# Patient Record
Sex: Female | Born: 1995 | Race: White | Hispanic: No | Marital: Single | Smoking: Current every day smoker
Health system: Southern US, Community
[De-identification: ages and names within clinical notes are randomized; demographics above are authoritative.]

## PROBLEM LIST (undated history)

## (undated) DIAGNOSIS — F909 Attention-deficit hyperactivity disorder, unspecified type: Secondary | ICD-10-CM

## (undated) DIAGNOSIS — N289 Disorder of kidney and ureter, unspecified: Secondary | ICD-10-CM

## (undated) DIAGNOSIS — F419 Anxiety disorder, unspecified: Secondary | ICD-10-CM

---

## 2014-10-10 ENCOUNTER — Emergency Department (HOSPITAL_BASED_OUTPATIENT_CLINIC_OR_DEPARTMENT_OTHER): Payer: Medicaid Other

## 2014-10-10 ENCOUNTER — Emergency Department (HOSPITAL_BASED_OUTPATIENT_CLINIC_OR_DEPARTMENT_OTHER)
Admission: EM | Admit: 2014-10-10 | Discharge: 2014-10-11 | Disposition: A | Discharge status: Home / Self Care | Payer: Medicaid Other | Attending: Emergency Medicine | Admitting: Emergency Medicine

## 2014-10-10 ENCOUNTER — Encounter (HOSPITAL_BASED_OUTPATIENT_CLINIC_OR_DEPARTMENT_OTHER): Payer: Self-pay

## 2014-10-10 DIAGNOSIS — R52 Pain, unspecified: Secondary | ICD-10-CM

## 2014-10-10 DIAGNOSIS — Z720 Tobacco use: Secondary | ICD-10-CM | POA: Insufficient documentation

## 2014-10-10 DIAGNOSIS — R109 Unspecified abdominal pain: Secondary | ICD-10-CM | POA: Insufficient documentation

## 2014-10-10 DIAGNOSIS — Z3202 Encounter for pregnancy test, result negative: Secondary | ICD-10-CM | POA: Diagnosis not present

## 2014-10-10 DIAGNOSIS — R112 Nausea with vomiting, unspecified: Secondary | ICD-10-CM | POA: Insufficient documentation

## 2014-10-10 DIAGNOSIS — R05 Cough: Secondary | ICD-10-CM | POA: Insufficient documentation

## 2014-10-10 DIAGNOSIS — Z79899 Other long term (current) drug therapy: Secondary | ICD-10-CM | POA: Diagnosis not present

## 2014-10-10 LAB — URINALYSIS, ROUTINE W REFLEX MICROSCOPIC
Bilirubin Urine: NEGATIVE
Glucose, UA: NEGATIVE mg/dL
Hgb urine dipstick: NEGATIVE
Ketones, ur: NEGATIVE mg/dL
Leukocytes, UA: NEGATIVE
Nitrite: NEGATIVE
PH: 7 (ref 5.0–8.0)
Protein, ur: NEGATIVE mg/dL
Specific Gravity, Urine: 1.012 (ref 1.005–1.030)
Urobilinogen, UA: 1 mg/dL (ref 0.0–1.0)

## 2014-10-10 LAB — CBC WITH DIFFERENTIAL/PLATELET
Basophils Absolute: 0 10*3/uL (ref 0.0–0.1)
Basophils Relative: 0 % (ref 0–1)
EOS ABS: 0.6 10*3/uL (ref 0.0–0.7)
EOS PCT: 6 % — AB (ref 0–5)
HEMATOCRIT: 37.9 % (ref 36.0–46.0)
Hemoglobin: 12.7 g/dL (ref 12.0–15.0)
LYMPHS ABS: 3 10*3/uL (ref 0.7–4.0)
LYMPHS PCT: 29 % (ref 12–46)
MCH: 31.1 pg (ref 26.0–34.0)
MCHC: 33.5 g/dL (ref 30.0–36.0)
MCV: 92.9 fL (ref 78.0–100.0)
Monocytes Absolute: 0.6 10*3/uL (ref 0.1–1.0)
Monocytes Relative: 6 % (ref 3–12)
Neutro Abs: 6 10*3/uL (ref 1.7–7.7)
Neutrophils Relative %: 59 % (ref 43–77)
Platelets: 242 10*3/uL (ref 150–400)
RBC: 4.08 MIL/uL (ref 3.87–5.11)
RDW: 13.6 % (ref 11.5–15.5)
WBC: 10.2 10*3/uL (ref 4.0–10.5)

## 2014-10-10 LAB — PREGNANCY, URINE: PREG TEST UR: NEGATIVE

## 2014-10-10 MED ORDER — KETOROLAC TROMETHAMINE 30 MG/ML IJ SOLN
30.0000 mg | Freq: Once | INTRAMUSCULAR | Status: AC
  Administered 2014-10-10: 30 mg via INTRAVENOUS
  Filled 2014-10-10: qty 1

## 2014-10-10 MED ORDER — SODIUM CHLORIDE 0.9 % IV BOLUS (SEPSIS)
500.0000 mL | Freq: Once | INTRAVENOUS | Status: AC
  Administered 2014-10-10: 500 mL via INTRAVENOUS

## 2014-10-10 NOTE — ED Notes (Signed)
Abd pain n/v x 2 hours

## 2014-10-10 NOTE — ED Provider Notes (Addendum)
CSN: [id number]     Arrival date & time 10/10/14  2141 History   This chart was scribed for Angel Olson Smitty CordsK Germani Gavilanes-Rasch, MD by Evon Slackerrance Branch, ED Scribe. This patient was seen in room MH10/MH10 and the patient's care was started at 11:04 PM.    Chief Complaint  Patient presents with  . Abdominal Pain   Patient is a 19 y.o. female presenting with abdominal pain. The history is provided by the patient. No language interpreter was used.  Abdominal Pain Pain location:  R flank Pain quality: sharp   Pain radiates to:  Does not radiate Pain severity:  Severe Onset quality:  Sudden Timing:  Constant Progression:  Unchanged Chronicity:  New Context: not retching   Relieved by:  Nothing Worsened by:  Nothing tried Ineffective treatments:  None tried Associated symptoms: cough, nausea and vomiting   Associated symptoms: no chest pain, no constipation, no diarrhea, no dysuria and no shortness of breath   Risk factors: no alcohol abuse    HPI Comments: Angel Olson is a 19 y.o. female who presents to the Emergency Department complaining of right sided abdominal pain onset 6 PM tonight. Pt rates the severity of her pain 9/10. Pt states she has associated nausea and vomiting.  Pt also presents with cough with URI.   Pt states that the pain feels as if "her insides are coming out." Pt states that pain began while at rest. Pt states she had normal bowel movement today. Pt denies any medication PTA. Pt denies any suspect food intake. Denies falls or injuries. Denies diarrhea, dysuria, frequency or constipation.   History reviewed. No pertinent past medical history. History reviewed. No pertinent past surgical history. No family history on file. History  Substance Use Topics  . Smoking status: Current Every Day Smoker  . Smokeless tobacco: Not on file  . Alcohol Use: No   OB History    No data available     Review of Systems  Respiratory: Positive for cough. Negative for chest tightness,  shortness of breath and wheezing.   Cardiovascular: Negative for chest pain, palpitations and leg swelling.  Gastrointestinal: Positive for nausea, vomiting and abdominal pain. Negative for diarrhea and constipation.  Genitourinary: Negative for dysuria and frequency.  All other systems reviewed and are negative.     Allergies  Review of patient's allergies indicates no known allergies.  Home Medications   Prior to Admission medications   Medication Sig Start Date End Date Taking? Authorizing Provider  ALPRAZolam (XANAX PO) Take by mouth.   Yes Historical Provider, MD  Amphetamine-Dextroamphetamine (ADDERALL PO) Take by mouth.   Yes Historical Provider, MD   Triage Vitals: BP 125/77 mmHg  Pulse 98  Temp(Src) 98.4 F (36.9 C) (Oral)  Resp 16  Ht 5\' 6"  (1.676 m)  Wt 130 lb (58.968 kg)  BMI 20.99 kg/m2  SpO2 95%  LMP 10/03/2014  Physical Exam  Constitutional: She is oriented to person, place, and time. She appears well-developed and well-nourished. No distress.  HENT:  Head: Normocephalic and atraumatic.  Mouth/Throat: Oropharynx is clear and moist.  Eyes: Conjunctivae and EOM are normal. Pupils are equal, round, and reactive to light.  Neck: Normal range of motion. Neck supple. No tracheal deviation present.  Cardiovascular: Normal rate and regular rhythm.   Pulmonary/Chest: Effort normal and breath sounds normal. No respiratory distress.  Abdominal: Soft. Bowel sounds are increased. There is no hepatosplenomegaly. There is no tenderness. There is no rebound, no guarding, no CVA tenderness, no  tenderness at McBurney's point and negative Murphy's sign.  Musculoskeletal: Normal range of motion.  Neurological: She is alert and oriented to person, place, and time. She has normal reflexes.  Skin: Skin is warm and dry.  Psychiatric: She has a normal mood and affect. Her behavior is normal.  Nursing note and vitals reviewed.   ED Course  Procedures (including critical care  time) DIAGNOSTIC STUDIES: Oxygen Saturation is 95% on RA, adequate by my interpretation.    COORDINATION OF CARE: 11:27 PM-Discussed treatment plan with pt at bedside and pt agreed to plan.     Labs Review Labs Reviewed  URINALYSIS, ROUTINE W REFLEX MICROSCOPIC  PREGNANCY, URINE    Imaging Review No results found.   EKG Interpretation None      MDM   Final diagnoses:  None    Scan and labs are negative pain is likely secondary to straining from coughing.  Will prescribe pain medication tessalon and muscle relaxant follow up with your PMD for ongoing care.    Patient resting comfortably in room at discharge patient now states there must be something wrong and has been at every healthcare system in this area for 2 years for pain.  The pain is 2 years duration.     I personally performed the services described in this documentation, which was scribed in my presence. The recorded information has been reviewed and is accurate.       Jasmine Awe, MD 10/11/14 [id number]  Lataya Varnell K Katryna Tschirhart-Rasch, MD 10/11/14 [phone]

## 2014-10-11 ENCOUNTER — Encounter (HOSPITAL_BASED_OUTPATIENT_CLINIC_OR_DEPARTMENT_OTHER): Payer: Self-pay | Admitting: Emergency Medicine

## 2014-10-11 LAB — COMPREHENSIVE METABOLIC PANEL
ALT: 70 U/L — AB (ref 0–35)
AST: 49 U/L — AB (ref 0–37)
Albumin: 4 g/dL (ref 3.5–5.2)
Alkaline Phosphatase: 64 U/L (ref 39–117)
Anion gap: 5 (ref 5–15)
BILIRUBIN TOTAL: 0.2 mg/dL — AB (ref 0.3–1.2)
BUN: 11 mg/dL (ref 6–23)
CALCIUM: 8.8 mg/dL (ref 8.4–10.5)
CHLORIDE: 109 meq/L (ref 96–112)
CO2: 25 mmol/L (ref 19–32)
CREATININE: 0.63 mg/dL (ref 0.50–1.10)
GFR calc Af Amer: >90 mL/min (ref >90)
GFR calc non Af Amer: >90 mL/min (ref >90)
Glucose, Bld: 116 mg/dL — ABNORMAL HIGH (ref 70–99)
Potassium: 3.7 mmol/L (ref 3.5–5.1)
Sodium: 139 mmol/L (ref 135–145)
Total Protein: 6.4 g/dL (ref 6.0–8.3)

## 2014-10-11 MED ORDER — METHOCARBAMOL 500 MG PO TABS
1000.0000 mg | ORAL_TABLET | Freq: Once | ORAL | Status: AC
  Administered 2014-10-11: 1000 mg via ORAL
  Filled 2014-10-11: qty 2

## 2014-10-11 MED ORDER — BENZONATATE 100 MG PO CAPS: 100.0000 mg | ORAL_CAPSULE | Freq: Three times a day (TID) | ORAL | Status: DC

## 2014-10-11 MED ORDER — OXYCODONE-ACETAMINOPHEN 5-325 MG PO TABS
1.0000 | ORAL_TABLET | Freq: Once | ORAL | Status: AC
  Administered 2014-10-11: 1 via ORAL
  Filled 2014-10-11: qty 1

## 2014-10-11 MED ORDER — METHOCARBAMOL 500 MG PO TABS: 500.0000 mg | ORAL_TABLET | Freq: Two times a day (BID) | ORAL | Status: DC

## 2014-10-11 MED ORDER — HYDROCODONE-ACETAMINOPHEN 7.5-325 MG/15ML PO SOLN: 10.0000 mL | Freq: Four times a day (QID) | ORAL | Status: DC | PRN

## 2015-05-13 IMAGING — CT CT RENAL STONE PROTOCOL
2 of 4 series · 16 of 46 positions shown, 18 images · non-contrast
Comparison: CT of the abdomen and pelvis from 02/28/2014, and
abdominal ultrasound performed 06/22/2014

CLINICAL DATA: Chronic right-sided abdominal pain for 2 years,
worsened tonight. Initial encounter.

EXAM:
CT ABDOMEN AND PELVIS WITHOUT CONTRAST
TECHNIQUE: Multidetector CT imaging of the abdomen and pelvis was performed
following the standard protocol without IV contrast.

[Series 2: renal stone < 200 lbs 5.0 b31f · axial · 0.77mm/px · z∈[-587,-157]mm · 13 of 96 slices shown, 15 images]
[im 5/96  soft-tissue]
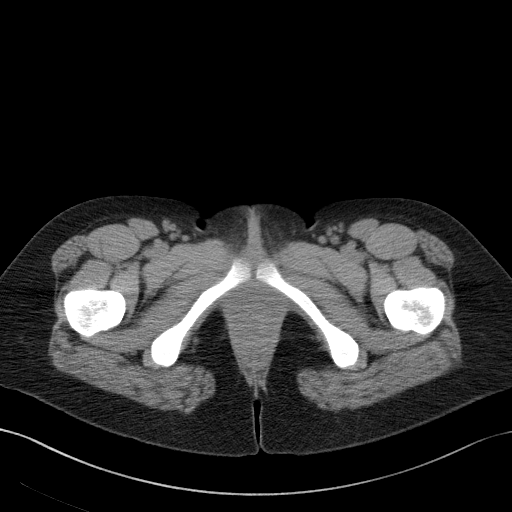
[im 5/96  bone]
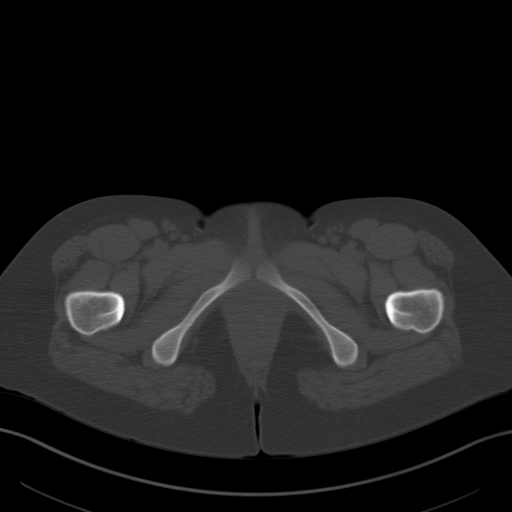
[im 13/96  soft-tissue]
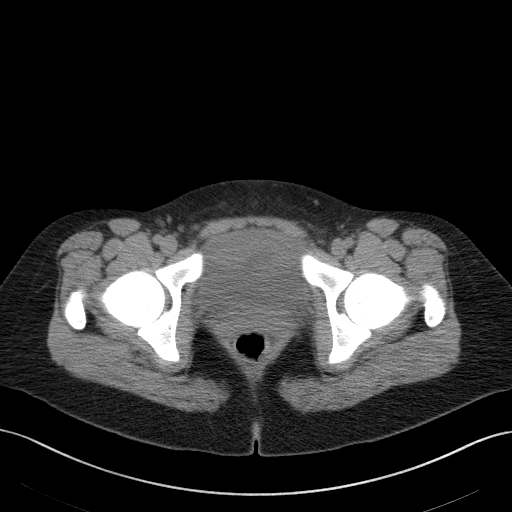
[im 22/96  soft-tissue]
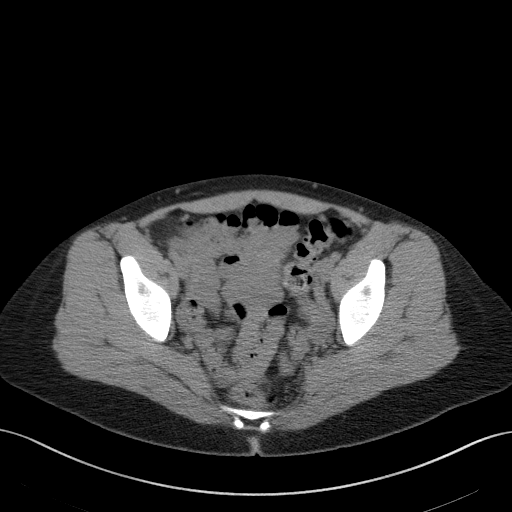
[im 26/96  soft-tissue]
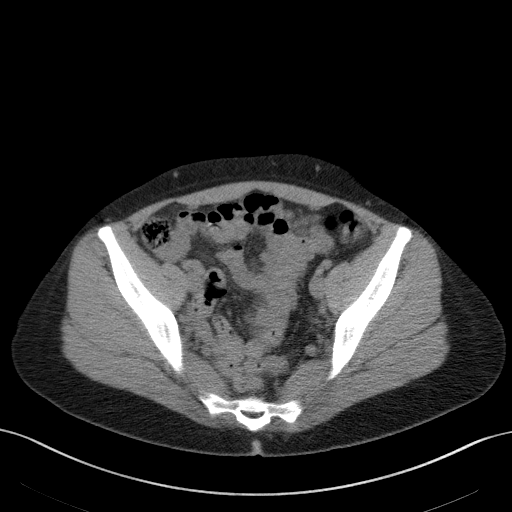
[im 35/96  soft-tissue]
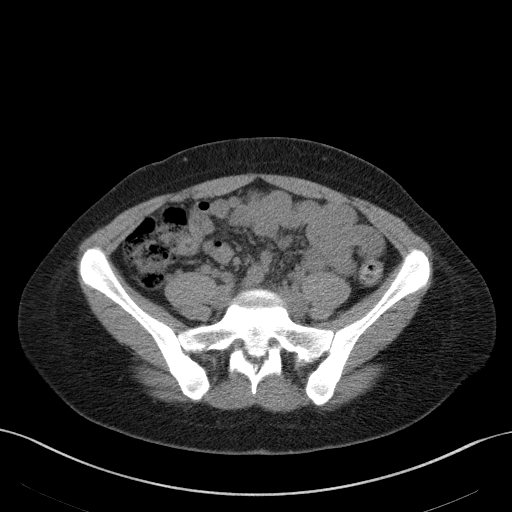
[im 39/96  soft-tissue]
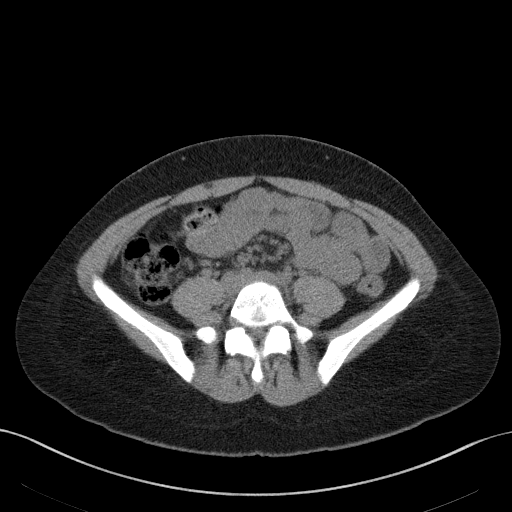
[im 48/96  soft-tissue]
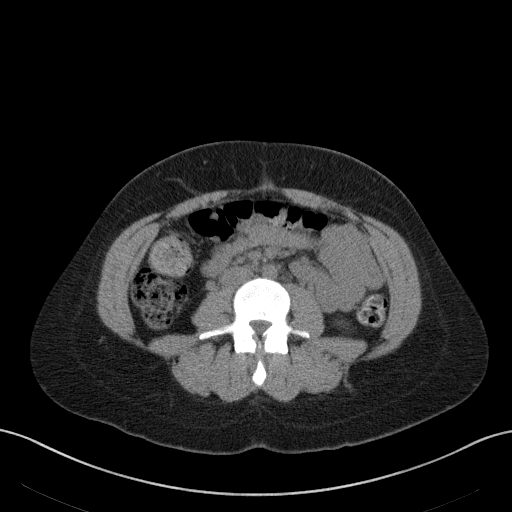
[im 57/96  soft-tissue]
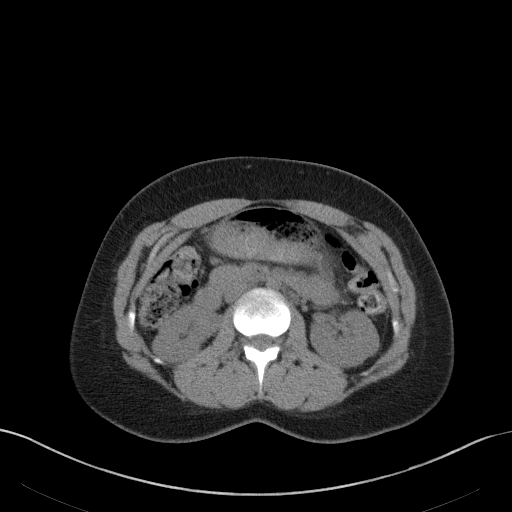
[im 61/96  soft-tissue]
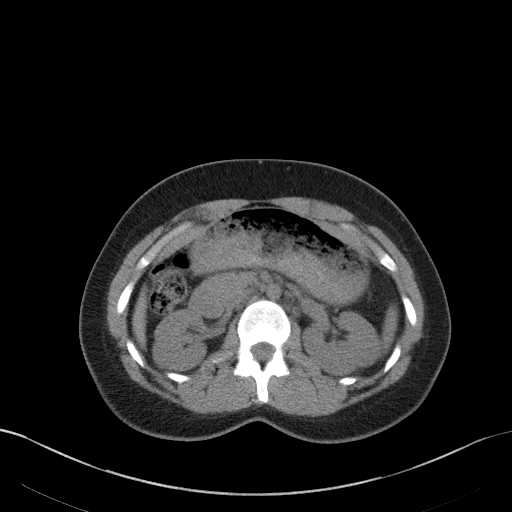
[im 61/96  bone]
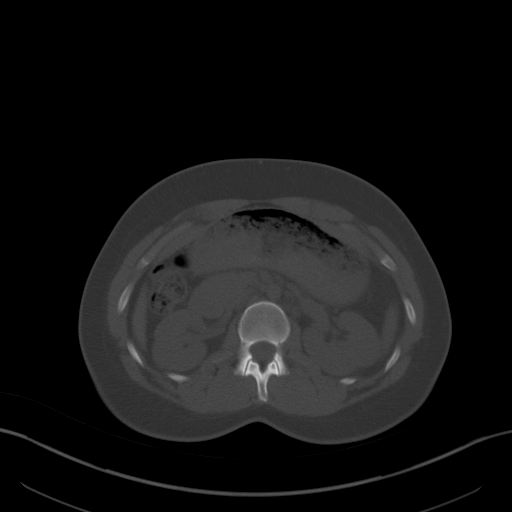
[im 70/96  soft-tissue]
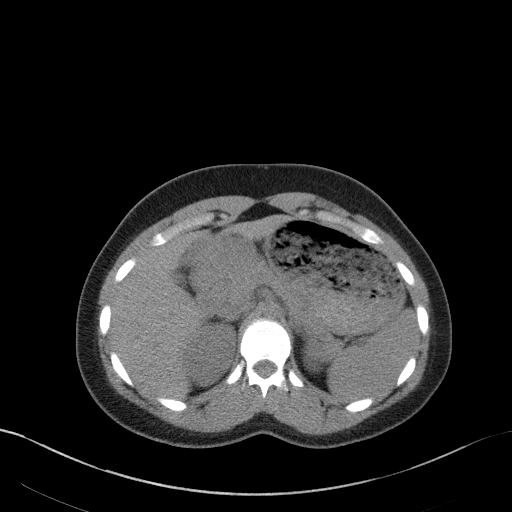
[im 74/96  soft-tissue]
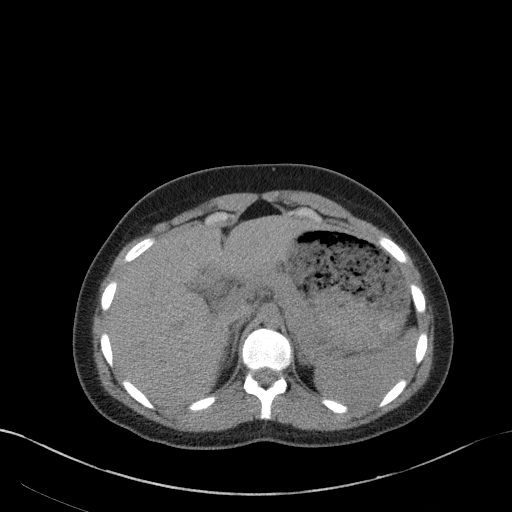
[im 83/96  soft-tissue]
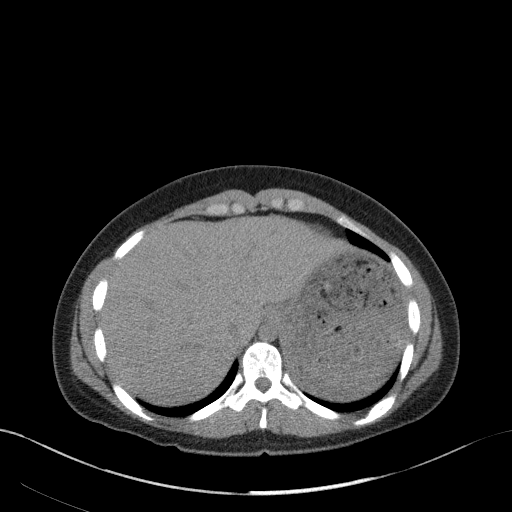
[im 91/96  soft-tissue]
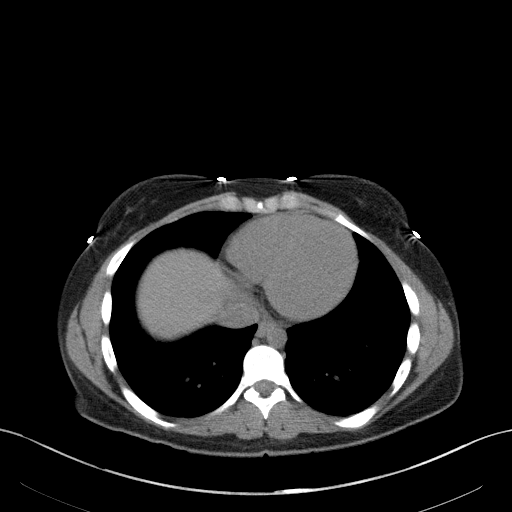

[Series 5: renal stone 3.0 coronal · coronal · 0.63mm/px · 3 of 75 slices shown]
[im 25/75  soft-tissue]
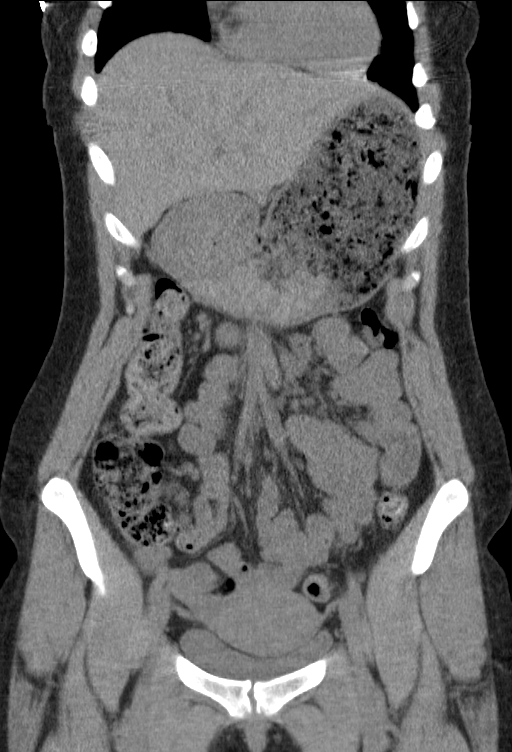
[im 33/75  soft-tissue]
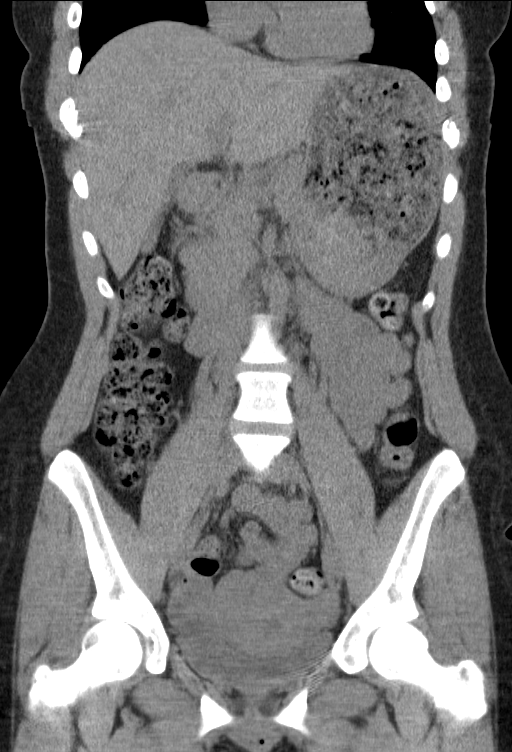
[im 42/75  soft-tissue]
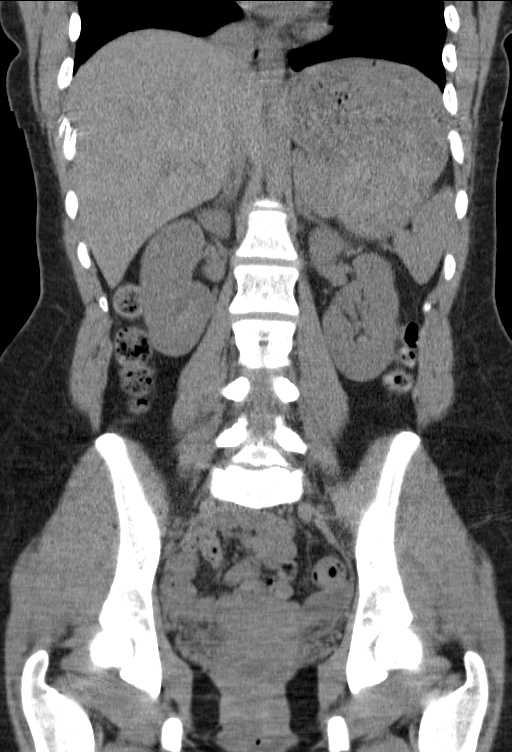

[16 of 46 positions shown; findings below may reference images not displayed]

FINDINGS: The visualized lung bases are clear.

The liver and spleen are unremarkable in appearance. The gallbladder
is within normal limits. The pancreas and adrenal glands are
unremarkable.

The kidneys are unremarkable in appearance. Mild bilateral fetal
lobulations are noted. There is no evidence of hydronephrosis. No
renal or ureteral stones are seen. No perinephric stranding is
appreciated.

No free fluid is identified. The small bowel is unremarkable in
appearance. The stomach is within normal limits. No acute vascular
abnormalities are seen.

The appendix is normal in caliber, without evidence for
appendicitis. The colon is unremarkable in appearance.

The bladder is mildly distended and grossly unremarkable. The uterus
is unremarkable in appearance. The ovaries are relatively symmetric.
No suspicious adnexal masses are seen. No inguinal lymphadenopathy
is seen.

No acute osseous abnormalities are identified.
IMPRESSION: Unremarkable noncontrast CT of the abdomen and pelvis.

## 2015-05-14 IMAGING — CR DG CHEST 2V
2 series · 2 of 2 positions shown · non-contrast
Comparison: None.

CLINICAL DATA: Mid chest pain and cough for 2 weeks.  Smoker.

EXAM:
CHEST  2 VIEW

[w chest pa]
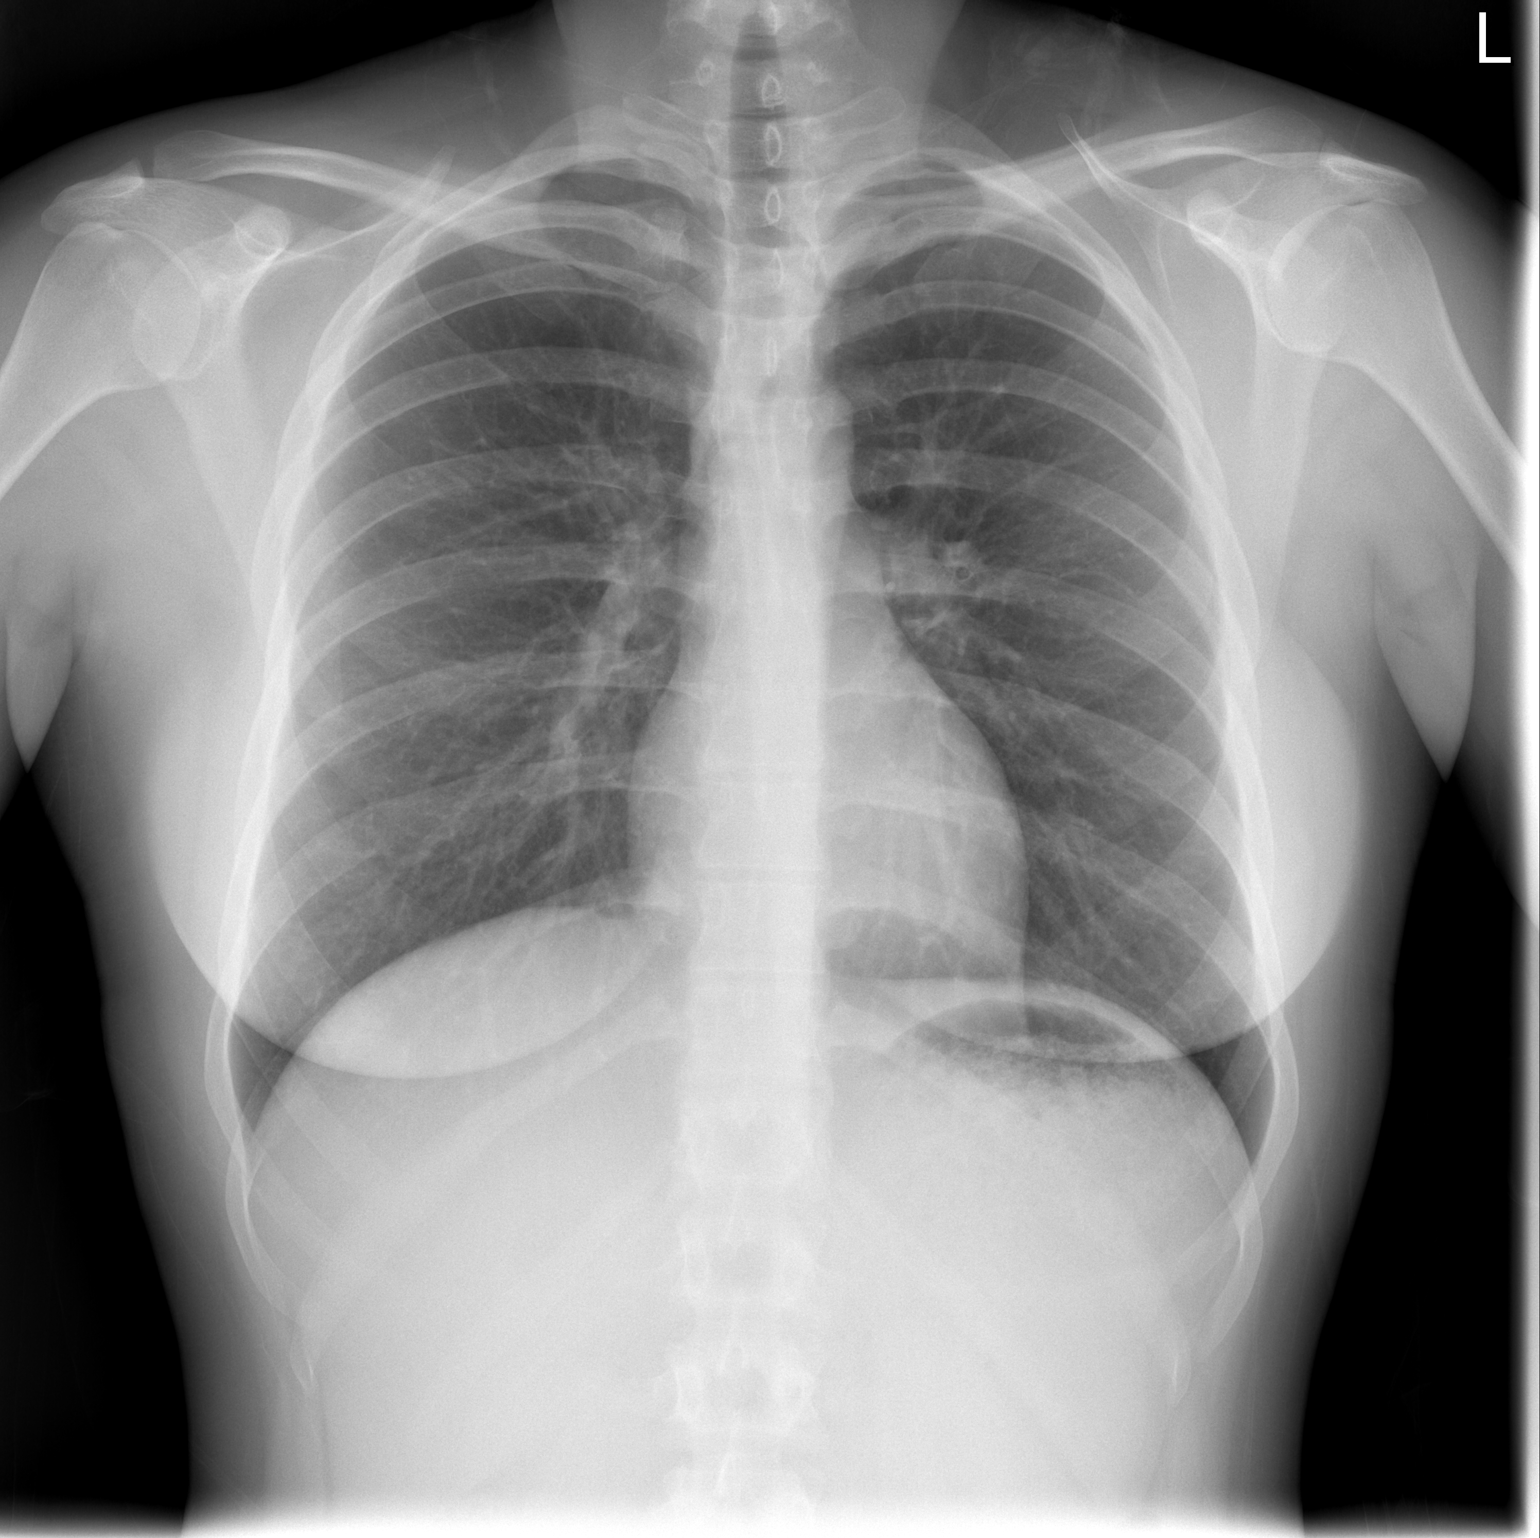

[w chest lat]
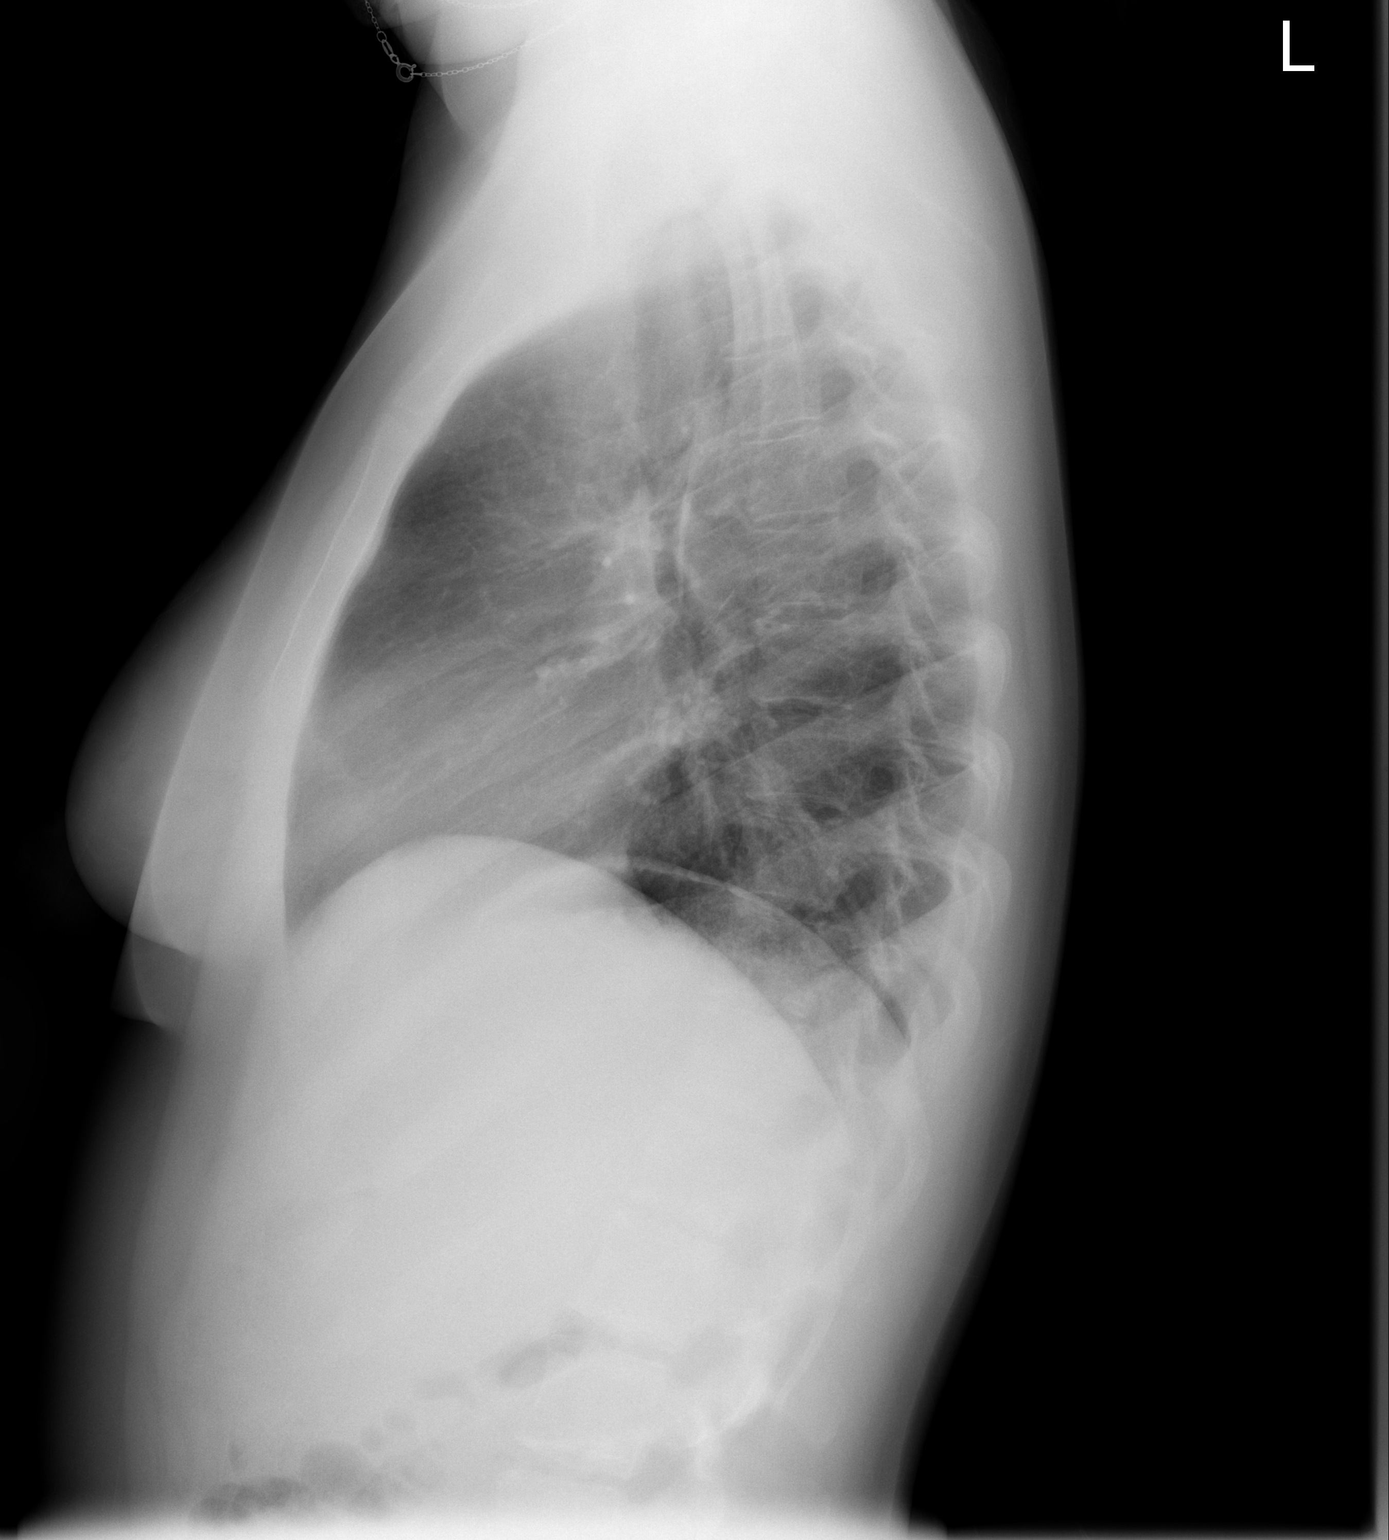

[2 of 2 positions shown; findings below may reference images not displayed]

FINDINGS: The heart size and mediastinal contours are within normal limits.
Both lungs are clear. The visualized skeletal structures are
unremarkable.
IMPRESSION: No active cardiopulmonary disease.

## 2016-03-05 ENCOUNTER — Encounter (HOSPITAL_BASED_OUTPATIENT_CLINIC_OR_DEPARTMENT_OTHER): Payer: Self-pay | Admitting: *Deleted

## 2016-03-05 ENCOUNTER — Emergency Department (HOSPITAL_BASED_OUTPATIENT_CLINIC_OR_DEPARTMENT_OTHER)
Admission: EM | Admit: 2016-03-05 | Discharge: 2016-03-05 | Disposition: A | Discharge status: Home / Self Care | Payer: Medicaid Other | Attending: Emergency Medicine | Admitting: Emergency Medicine

## 2016-03-05 DIAGNOSIS — L02215 Cutaneous abscess of perineum: Secondary | ICD-10-CM | POA: Insufficient documentation

## 2016-03-05 DIAGNOSIS — L02219 Cutaneous abscess of trunk, unspecified: Secondary | ICD-10-CM

## 2016-03-05 DIAGNOSIS — F17210 Nicotine dependence, cigarettes, uncomplicated: Secondary | ICD-10-CM | POA: Insufficient documentation

## 2016-03-05 DIAGNOSIS — F909 Attention-deficit hyperactivity disorder, unspecified type: Secondary | ICD-10-CM | POA: Insufficient documentation

## 2016-03-05 HISTORY — DX: Anxiety disorder, unspecified: F41.9

## 2016-03-05 HISTORY — DX: Attention-deficit hyperactivity disorder, unspecified type: F90.9

## 2016-03-05 MED ORDER — LIDOCAINE-EPINEPHRINE 2 %-1:100000 IJ SOLN
20.0000 mL | Freq: Once | INTRAMUSCULAR | Status: AC
  Administered 2016-03-05: 20 mL via INTRADERMAL

## 2016-03-05 MED ORDER — LIDOCAINE-EPINEPHRINE 2 %-1:100000 IJ SOLN
INTRAMUSCULAR | Status: AC
  Administered 2016-03-05: 20 mL via INTRADERMAL
  Filled 2016-03-05: qty 1

## 2016-03-05 MED ORDER — HYDROCODONE-ACETAMINOPHEN 5-325 MG PO TABS: 1.0000 | ORAL_TABLET | Freq: Once | ORAL | Status: DC

## 2016-03-05 MED ORDER — IBUPROFEN 800 MG PO TABS: 800.0000 mg | ORAL_TABLET | Freq: Three times a day (TID) | ORAL | Status: DC | PRN

## 2016-03-05 MED ORDER — HYDROMORPHONE HCL 1 MG/ML IJ SOLN
2.0000 mg | Freq: Once | INTRAMUSCULAR | Status: AC
  Administered 2016-03-05: 2 mg via INTRAMUSCULAR
  Filled 2016-03-05: qty 2

## 2016-03-05 MED ORDER — LIDOCAINE-EPINEPHRINE (PF) 2 %-1:200000 IJ SOLN: 10.0000 mL | Freq: Once | INTRAMUSCULAR | Status: DC

## 2016-03-05 MED ORDER — SULFAMETHOXAZOLE-TRIMETHOPRIM 800-160 MG PO TABS: 1.0000 | ORAL_TABLET | Freq: Once | ORAL | Status: DC

## 2016-03-05 MED ORDER — SULFAMETHOXAZOLE-TRIMETHOPRIM 800-160 MG PO TABS: 1.0000 | ORAL_TABLET | Freq: Two times a day (BID) | ORAL | Status: DC

## 2016-03-05 MED ORDER — HYDROCODONE-ACETAMINOPHEN 5-325 MG PO TABS: 1.0000 | ORAL_TABLET | ORAL | Status: DC | PRN

## 2016-03-05 MED ORDER — CEPHALEXIN 500 MG PO CAPS: 500.0000 mg | ORAL_CAPSULE | Freq: Four times a day (QID) | ORAL | Status: DC

## 2016-03-05 NOTE — ED Provider Notes (Signed)
CSN: [id number]     Arrival date & time 03/05/16  1254 History   First MD Initiated Contact with Patient 03/05/16 1303     Chief Complaint  Patient presents with  . Abscess     (Consider location/radiation/quality/duration/timing/severity/associated sxs/prior Treatment) The history is provided by the patient.     Pt presents with 4 days of large swollen area in the left pubic region.  It is painful.  Associated fever to 100 that began last night, nausea, inguinal lymph node enlargement. Has tried warm compresses without improvement.  Denies vomiting, urinary, vaginal, or bowel symptoms.  LMP started 3 days ago and was normal and on time.    Past Medical History  Diagnosis Date  . Anxiety   . ADHD (attention deficit hyperactivity disorder)    History reviewed. No pertinent past surgical history. No family history on file. Social History  Substance Use Topics  . Smoking status: Current Every Day Smoker -- 0.50 packs/day    Types: Cigarettes  . Smokeless tobacco: None  . Alcohol Use: No   OB History    No data available     Review of Systems  All other systems reviewed and are negative.     Allergies  Review of patient's allergies indicates no known allergies.  Home Medications   Prior to Admission medications   Medication Sig Start Date End Date Taking? Authorizing Provider  ALPRAZolam (XANAX PO) Take by mouth.    Historical Provider, MD  Amphetamine-Dextroamphetamine (ADDERALL PO) Take by mouth.    Historical Provider, MD  benzonatate (TESSALON) 100 MG capsule Take 1 capsule (100 mg total) by mouth every 8 (eight) hours. 10/11/14   April Palumbo, MD  HYDROcodone-acetaminophen (HYCET) 7.5-325 mg/15 ml solution Take 10 mLs by mouth every 6 (six) hours as needed for severe pain. 10/11/14   April Palumbo, MD  methocarbamol (ROBAXIN) 500 MG tablet Take 1 tablet (500 mg total) by mouth 2 (two) times daily. 10/11/14   April Palumbo, MD   BP 111/69 mmHg  Pulse 107  Temp(Src) 98  F (36.7 C) (Oral)  Resp 20  Ht  (1.702 m)  Wt 58.968 kg  BMI 20.36 kg/m2  SpO2 100%  LMP 03/01/2016 Physical Exam  Constitutional: She appears well-developed and well-nourished. No distress.  Uncomfortable appearing   HENT:  Head: Normocephalic and atraumatic.  Neck: Neck supple.  Pulmonary/Chest: Effort normal.  Genitourinary:  Shaved pubic area with few scattered pustules.  Left mons with raised area with fluctuance, central black escar.  No active drainage.  Tender to palpation.   Few scattered papules and pustules over shaved pubic area.    Neurological: She is alert.  Skin: She is not diaphoretic.  Nursing note and vitals reviewed.   ED Course  Procedures (including critical care time) Labs Review Labs Reviewed - No data to display  Imaging Review No results found. I have personally reviewed and evaluated these images and lab results as part of my medical decision-making.   EKG Interpretation None       INCISION AND DRAINAGE Performed by: Trixie Dredge Consent: Verbal consent obtained. Risks and benefits: risks, benefits and alternatives were discussed Type: abscess  Body area: left mons  Anesthesia: local infiltration  Incision was made with a scalpel.  Local anesthetic: lidocaine 2% with epinephrine  Anesthetic total: 4 ml  Complexity: complex Blunt dissection to break up loculations  Drainage: purulent  Drainage amount: moderate   Packing material: none  Flushed abscess cavity with normal saline.  Patient tolerance: Patient tolerated the procedure well with no immediate complications.     MDM   Final diagnoses:  Abscess of pubic region   Afebrile, nontoxic patient with abscess over pubic region.  No overlying cellulitis.  She does not appear septic.  Pt has multiple smaller areas with pustules c/w folliculitis.  She has shaved the entire area.  I have advised her to stop shaving until all of these areas are clear.  I&D in ED.   Discussed home care.  Pt given bactrim in ED.   D/C home with bactrim, keflex, norco, motrin, return precautions, suggested 2 days recheck.  Discussed result, findings, treatment, and follow up  with patient.  Pt given return precautions.  Pt verbalizes understanding and agrees with plan.        Trixie Dredgemily Yanelli Zapanta, PA-C 03/05/16 1627  Raeford RazorStephen Kohut, MD 03/12/16 [phone]

## 2016-03-05 NOTE — ED Notes (Signed)
States she has a large boil the size of a baseball on her pubic area.

## 2016-03-05 NOTE — ED Notes (Signed)
Family at bedside. 

## 2016-03-05 NOTE — ED Notes (Signed)
Assisted PA with I&D of abcess on Pt. Pelvic region.

## 2016-03-05 NOTE — Discharge Instructions (Signed)
Read the information below.  Use the prescribed medication as directed.  Please discuss all new medications with your pharmacist.  Do not take additional tylenol while taking the prescribed pain medication to avoid overdose.  You may return to the Emergency Department at any time for worsening condition or any new symptoms that concern you.    If you develop increased redness, swelling, worsening pain, or fevers greater than 100.4, return to the ER immediately for a recheck.     Abscess An abscess is an infected area that contains a collection of pus and debris.It can occur in almost any part of the body. An abscess is also known as a furuncle or boil. CAUSES  An abscess occurs when tissue gets infected. This can occur from blockage of oil or sweat glands, infection of hair follicles, or a minor injury to the skin. As the body tries to fight the infection, pus collects in the area and creates pressure under the skin. This pressure causes pain. People with weakened immune systems have difficulty fighting infections and get certain abscesses more often.  SYMPTOMS Usually an abscess develops on the skin and becomes a painful mass that is red, warm, and tender. If the abscess forms under the skin, you may feel a moveable soft area under the skin. Some abscesses break open (rupture) on their own, but most will continue to get worse without care. The infection can spread deeper into the body and eventually into the bloodstream, causing you to feel ill.  DIAGNOSIS  Your caregiver will take your medical history and perform a physical exam. A sample of fluid may also be taken from the abscess to determine what is causing your infection. TREATMENT  Your caregiver may prescribe antibiotic medicines to fight the infection. However, taking antibiotics alone usually does not cure an abscess. Your caregiver may need to make a small cut (incision) in the abscess to drain the pus. In some cases, gauze is packed into the  abscess to reduce pain and to continue draining the area. HOME CARE INSTRUCTIONS   Only take over-the-counter or prescription medicines for pain, discomfort, or fever as directed by your caregiver.  If you were prescribed antibiotics, take them as directed. Finish them even if you start to feel better.  If gauze is used, follow your caregiver's directions for changing the gauze.  To avoid spreading the infection:  Keep your draining abscess covered with a bandage.  Wash your hands well.  Do not share personal care items, towels, or whirlpools with others.  Avoid skin contact with others.  Keep your skin and clothes clean around the abscess.  Keep all follow-up appointments as directed by your caregiver. SEEK MEDICAL CARE IF:   You have increased pain, swelling, redness, fluid drainage, or bleeding.  You have muscle aches, chills, or a general ill feeling.  You have a fever. MAKE SURE YOU:   Understand these instructions.  Will watch your condition.  Will get help right away if you are not doing well or get worse.   This information is not intended to replace advice given to you by your health care provider. Make sure you discuss any questions you have with your health care provider.   Document Released: 07/01/2005 Document Revised: 03/22/2012 Document Reviewed: 12/04/2011 Elsevier Interactive Patient Education 2016 Else[employer] Inc[employer]  Incision and Drainage Incision and drainage is a procedure in which a sac-like structure (cystic structure) is opened and drained. The area to be drained usually contains material such as  pus, fluid, or blood.  LET YOUR CAREGIVER KNOW ABOUT:   Allergies to medicine.  Medicines taken, including vitamins, herbs, eyedrops, over-the-counter medicines, and creams.  Use of steroids (by mouth or creams).  Previous problems with anesthetics or numbing medicines.  History of bleeding problems or blood clots.  Previous surgery.  Other health  problems, including diabetes and kidney problems.  Possibility of pregnancy, if this applies. RISKS AND COMPLICATIONS  Pain.  Bleeding.  Scarring.  Infection. BEFORE THE PROCEDURE  You may need to have an ultrasound or other imaging tests to see how large or deep your cystic structure is. Blood tests may also be used to determine if you have an infection or how severe the infection is. You may need to have a tetanus shot. PROCEDURE  The affected area is cleaned with a cleaning fluid. The cyst area will then be numbed with a medicine (local anesthetic). A small incision will be made in the cystic structure. A syringe or catheter may be used to drain the contents of the cystic structure, or the contents may be squeezed out. The area will then be flushed with a cleansing solution. After cleansing the area, it is often gently packed with a gauze or another wound dressing. Once it is packed, it will be covered with gauze and tape or some other type of wound dressing. AFTER THE PROCEDURE   Often, you will be allowed to go home right after the procedure.  You may be given antibiotic medicine to prevent or heal an infection.  If the area was packed with gauze or some other wound dressing, you will likely need to come back in 1 to 2 days to get it removed.  The area should heal in about 14 days.   This information is not intended to replace advice given to you by your health care provider. Make sure you discuss any questions you have with your health care provider.   Document Released: 03/17/2001 Document Revised: 03/22/2012 Document Reviewed: 11/16/2011 Elsevier Interactive Patient Education [employer] Else[employer] Inc[employer]

## 2019-12-05 ENCOUNTER — Other Ambulatory Visit: Payer: Self-pay

## 2019-12-05 DIAGNOSIS — F17210 Nicotine dependence, cigarettes, uncomplicated: Secondary | ICD-10-CM | POA: Insufficient documentation

## 2019-12-05 DIAGNOSIS — R102 Pelvic and perineal pain: Secondary | ICD-10-CM | POA: Insufficient documentation

## 2019-12-05 DIAGNOSIS — Z79899 Other long term (current) drug therapy: Secondary | ICD-10-CM | POA: Insufficient documentation

## 2019-12-05 DIAGNOSIS — A599 Trichomoniasis, unspecified: Secondary | ICD-10-CM | POA: Insufficient documentation

## 2019-12-05 DIAGNOSIS — N939 Abnormal uterine and vaginal bleeding, unspecified: Secondary | ICD-10-CM | POA: Insufficient documentation

## 2019-12-06 ENCOUNTER — Ambulatory Visit (HOSPITAL_BASED_OUTPATIENT_CLINIC_OR_DEPARTMENT_OTHER): Admit: 2019-12-06 | Payer: Self-pay

## 2019-12-06 ENCOUNTER — Emergency Department (HOSPITAL_BASED_OUTPATIENT_CLINIC_OR_DEPARTMENT_OTHER)
Admission: EM | Admit: 2019-12-06 | Discharge: 2019-12-06 | Disposition: A | Discharge status: Home / Self Care | Payer: Self-pay | Attending: Emergency Medicine | Admitting: Emergency Medicine

## 2019-12-06 ENCOUNTER — Encounter (HOSPITAL_BASED_OUTPATIENT_CLINIC_OR_DEPARTMENT_OTHER): Payer: Self-pay | Admitting: Oncology

## 2019-12-06 ENCOUNTER — Other Ambulatory Visit: Payer: Self-pay

## 2019-12-06 DIAGNOSIS — R102 Pelvic and perineal pain: Secondary | ICD-10-CM

## 2019-12-06 DIAGNOSIS — A599 Trichomoniasis, unspecified: Secondary | ICD-10-CM

## 2019-12-06 DIAGNOSIS — N939 Abnormal uterine and vaginal bleeding, unspecified: Secondary | ICD-10-CM

## 2019-12-06 LAB — URINALYSIS, ROUTINE W REFLEX MICROSCOPIC
Bilirubin Urine: NEGATIVE
Glucose, UA: NEGATIVE mg/dL
Ketones, ur: NEGATIVE mg/dL
Leukocytes,Ua: NEGATIVE
Nitrite: NEGATIVE
Protein, ur: NEGATIVE mg/dL
Specific Gravity, Urine: 1.025 (ref 1.005–1.030)
pH: 6.5 (ref 5.0–8.0)

## 2019-12-06 LAB — URINALYSIS, MICROSCOPIC (REFLEX)

## 2019-12-06 LAB — WET PREP, GENITAL
Clue Cells Wet Prep HPF POC: NONE SEEN
Sperm: NONE SEEN
Yeast Wet Prep HPF POC: NONE SEEN

## 2019-12-06 LAB — HCG, QUANTITATIVE, PREGNANCY: hCG, Beta Chain, Quant, S: <1 m[IU]/mL (ref <5)

## 2019-12-06 LAB — PREGNANCY, URINE: Preg Test, Ur: NEGATIVE

## 2019-12-06 MED ORDER — METRONIDAZOLE 500 MG PO TABS
2000.0000 mg | ORAL_TABLET | Freq: Once | ORAL | Status: AC
  Administered 2019-12-06: 2000 mg via ORAL
  Filled 2019-12-06: qty 4

## 2019-12-06 NOTE — ED Provider Notes (Addendum)
Angel Olson [facility] Provider Note: Angel Spurling, MD, FACEP  CSN: [id number] MRN: [id number] ARRIVAL: 12/05/19 at [place]: [place]  Abdominal Pain   HISTORY OF PRESENT ILLNESS  12/06/19 12:11 AM Angel Olson is a 24 y.o. female who had a positive pregnancy test about a week and a half ago.  She is here with 2 days of right flank pain radiating to the right lower quadrant.  She rates the pain is a 6 out of 10.  It is cramping in nature.  It has been associated with red urine, and nausea but no vomiting.  She denies fever or chills.  She is also having a brown vaginal discharge.  She is also having sharp mid low back pain.  Blood type is known to be B+.   Past Medical History:  Diagnosis Date  . ADHD (attention deficit hyperactivity disorder)   . Anxiety     History reviewed. No pertinent surgical history.  No family history on file.  Social History   Tobacco Use  . Smoking status: Current Every Day Smoker    Packs/day: 0.50    Types: Cigarettes  . Smokeless tobacco: Never Used  Substance Use Topics  . Alcohol use: No  . Drug use: Not Currently    Prior to Admission medications   Medication Sig Start Date End Date Taking? Authorizing Provider  ALPRAZolam (XANAX PO) Take by mouth.    [provider]  Amphetamine-Dextroamphetamine (ADDERALL PO) Take by mouth.    [provider]    Allergies Patient has no known allergies.   REVIEW OF SYSTEMS  Negative except as noted here or in the History of Present Illness.   PHYSICAL EXAMINATION  Initial Vital Signs Blood pressure 124/76, pulse 91, temperature 98.3 F (36.8 C), temperature source Oral, resp. rate 16, height 5\' 6"  (1.676 m), weight 63.5 kg, last menstrual period 09/16/2019, SpO2 99 %.  Examination General: Well-developed, well-nourished female in no acute distress; appearance consistent with age of record HENT: normocephalic; atraumatic Eyes: pupils equal, round  and reactive to light; extraocular muscles intact Neck: supple Heart: regular rate and rhythm Lungs: clear to auscultation bilaterally Abdomen: soft; nondistended; right lower quadrant tenderness; no masses or hepatosplenomegaly; bowel sounds present GU: Right CVA tenderness; normal external genitalia; light vaginal bleeding; no vaginal discharge; mild cervical motion tenderness; right adnexal tenderness Extremities: No deformity; full range of motion Neurologic: Awake, alert and oriented; motor function intact in all extremities and symmetric; no facial droop Skin: Warm and dry Psychiatric: Normal mood and affect   RESULTS  Summary of this visit's results, reviewed and interpreted by myself:   EKG Interpretation  Date/Time:    Ventricular Rate:    PR Interval:    QRS Duration:   QT Interval:    QTC Calculation:   R Axis:     Text Interpretation:        Laboratory Studies: Results for orders placed or performed during the hospital encounter of 12/06/19 (from the past 24 hour(s))  Urinalysis, Routine w reflex microscopic     Status: Abnormal   Collection Time: 12/06/19 12:11 AM  Result Value Ref Range   Color, Urine YELLOW YELLOW   APPearance CLOUDY (A) CLEAR   Specific Gravity, Urine 1.025 1.005 - 1.030   pH 6.5 5.0 - 8.0   Glucose, UA NEGATIVE NEGATIVE mg/dL   Hgb urine dipstick LARGE (A) NEGATIVE   Bilirubin Urine NEGATIVE NEGATIVE   Ketones, ur NEGATIVE NEGATIVE mg/dL  Protein, ur NEGATIVE NEGATIVE mg/dL   Nitrite NEGATIVE NEGATIVE   Leukocytes,Ua NEGATIVE NEGATIVE  Pregnancy, urine     Status: None   Collection Time: 12/06/19 12:11 AM  Result Value Ref Range   Preg Test, Ur NEGATIVE NEGATIVE  Urinalysis, Microscopic (reflex)     Status: Abnormal   Collection Time: 12/06/19 12:11 AM  Result Value Ref Range   RBC / HPF 6-10 0 - 5 RBC/hpf   WBC, UA 0-5 0 - 5 WBC/hpf   Bacteria, UA MANY (A) NONE SEEN   Squamous Epithelial / LPF 6-10 0 - 5   Mucus PRESENT     Trichomonas, UA PRESENT (A) NONE SEEN  hCG, quantitative, pregnancy     Status: None   Collection Time: 12/06/19 12:29 AM  Result Value Ref Range   hCG, Beta Chain, Quant, S <1 <5 mIU/mL  Wet prep, genital     Status: Abnormal   Collection Time: 12/06/19 12:30 AM   Specimen: Cervix  Result Value Ref Range   Yeast Wet Prep HPF POC NONE SEEN NONE SEEN   Trich, Wet Prep PRESENT (A) NONE SEEN   Clue Cells Wet Prep HPF POC NONE SEEN NONE SEEN   WBC, Wet Prep HPF POC MANY (A) NONE SEEN   Sperm NONE SEEN    Imaging Studies: No results found.  ED COURSE and MDM  Nursing notes, initial and subsequent vitals signs, including pulse oximetry, reviewed and interpreted by myself.  Vitals:   12/06/19 0003 12/06/19 0004 12/06/19 0102  BP: 124/76  118/68  Pulse: 91  89  Resp: 16  20  Temp: 98.3 F (36.8 C)  98.1 F (36.7 C)  TempSrc: Oral  Oral  SpO2: 99%  100%  Weight:  63.5 kg   Height:  5\' 6"  (1.676 m)    Medications  metroNIDAZOLE (FLAGYL) tablet 2,000 mg (2,000 mg Oral Given 12/06/19 0056)   1:15 AM Patient given Flagyl 2 g for trichomoniasis.  Right lower quadrant pain is concerning for an ovarian cyst.  We will have her return for a pelvic ultrasound later today.  GC and Chlamydia testing are pending.  Her boyfriend is checking in to be tested/treated for STDs.   PROCEDURES  Procedures   ED DIAGNOSES     ICD-10-CM   1. Trichomoniasis  A59.9   2. Vaginal bleeding  N93.9   3. Pelvic pain  R10.2        02/05/20, MD 12/06/19 02/05/20    8676, MD 12/06/19 [phone]

## 2019-12-06 NOTE — ED Triage Notes (Signed)
Pt reports +pregnancy test at home last week.  States she has been having lower abd pain x 2 days. Endorses brown vaginal discharge as well as red urine.  7/10 cramping in nature.

## 2019-12-06 NOTE — ED Notes (Signed)
Pt visibly upset when informed she was informed that she tested positive for trichomonas. Pt stated, "I've never heard of that I think you're making it up." Explained to pt how one contracts trichomonas and encouraged pt to have any sexual partners tested and treated as well as abstain from sexual intercourse for at least two weeks.

## 2019-12-07 LAB — GC/CHLAMYDIA PROBE AMP (~~LOC~~) NOT AT ARMC
Chlamydia: NEGATIVE
Neisseria Gonorrhea: NEGATIVE

## 2023-01-24 ENCOUNTER — Encounter (HOSPITAL_BASED_OUTPATIENT_CLINIC_OR_DEPARTMENT_OTHER): Payer: Self-pay | Admitting: Emergency Medicine

## 2023-01-24 ENCOUNTER — Other Ambulatory Visit: Payer: Self-pay

## 2023-01-24 ENCOUNTER — Emergency Department (HOSPITAL_BASED_OUTPATIENT_CLINIC_OR_DEPARTMENT_OTHER)
Admission: EM | Admit: 2023-01-24 | Discharge: 2023-01-24 | Disposition: A | Discharge status: Home / Self Care | Payer: 59 | Attending: Emergency Medicine | Admitting: Emergency Medicine

## 2023-01-24 ENCOUNTER — Emergency Department (HOSPITAL_BASED_OUTPATIENT_CLINIC_OR_DEPARTMENT_OTHER): Payer: 59

## 2023-01-24 DIAGNOSIS — L03116 Cellulitis of left lower limb: Secondary | ICD-10-CM | POA: Insufficient documentation

## 2023-01-24 HISTORY — DX: Disorder of kidney and ureter, unspecified: N28.9

## 2023-01-24 LAB — CBC
HCT: 32.7 % — ABNORMAL LOW (ref 36.0–46.0)
Hemoglobin: 10.3 g/dL — ABNORMAL LOW (ref 12.0–15.0)
MCH: 26.8 pg (ref 26.0–34.0)
MCHC: 31.5 g/dL (ref 30.0–36.0)
MCV: 84.9 fL (ref 80.0–100.0)
Platelets: 326 10*3/uL (ref 150–400)
RBC: 3.85 MIL/uL — ABNORMAL LOW (ref 3.87–5.11)
RDW: 14.9 % (ref 11.5–15.5)
WBC: 6.8 10*3/uL (ref 4.0–10.5)
nRBC: 0 % (ref 0.0–0.2)

## 2023-01-24 LAB — BASIC METABOLIC PANEL
Anion gap: 7 (ref 5–15)
BUN: 12 mg/dL (ref 6–20)
CO2: 27 mmol/L (ref 22–32)
Calcium: 8.6 mg/dL — ABNORMAL LOW (ref 8.9–10.3)
Chloride: 101 mmol/L (ref 98–111)
Creatinine, Ser: 0.69 mg/dL (ref 0.44–1.00)
GFR, Estimated: >60 mL/min (ref >60)
Glucose, Bld: 86 mg/dL (ref 70–99)
Potassium: 3.3 mmol/L — ABNORMAL LOW (ref 3.5–5.1)
Sodium: 135 mmol/L (ref 135–145)

## 2023-01-24 LAB — HCG, QUANTITATIVE, PREGNANCY: hCG, Beta Chain, Quant, S: <1 m[IU]/mL (ref <5)

## 2023-01-24 MED ORDER — IBUPROFEN 400 MG PO TABS
400.0000 mg | ORAL_TABLET | Freq: Once | ORAL | Status: AC
  Administered 2023-01-24: 400 mg via ORAL
  Filled 2023-01-24: qty 1

## 2023-01-24 MED ORDER — DOXYCYCLINE HYCLATE 100 MG PO TABS
100.0000 mg | ORAL_TABLET | Freq: Once | ORAL | Status: AC
  Administered 2023-01-24: 100 mg via ORAL
  Filled 2023-01-24: qty 1

## 2023-01-24 MED ORDER — DOXYCYCLINE HYCLATE 100 MG PO CAPS: 100.0000 mg | ORAL_CAPSULE | Freq: Two times a day (BID) | ORAL | 0 refills | Status: DC

## 2023-01-24 NOTE — ED Triage Notes (Signed)
Patient arrived via POV c/o left leg swelling x 4 days. Patient states "she thinks she got bit by something". Patient has visible scab with mottled appearance above ankle. Patient is AO x 4, VS WDL, normal gait.

## 2023-01-24 NOTE — ED Triage Notes (Signed)
No pt visualized in lobby.

## 2023-01-24 NOTE — ED Provider Notes (Signed)
EMERGENCY DEPARTMENT AT [facility] Provider Note   CSN: [id number] Arrival date & time: 01/24/23  0335     History  Chief Complaint  Patient presents with   Leg Swelling    Angel Olson is a 27 y.o. female.  The history is provided by the patient.  Patient history of anxiety and ADHD presents with left leg pain and swelling.  She reports this has been ongoing for about 4 days.  She does not recall any trauma.  She thinks she was bit by something but admits she has never actually seen anything crawling her.  No tick bites.  She reported her cat scratched her but this occurred after the swelling started. No previous history of CAD/VTE.  She denies IV drug use. She also reports left shoulder and left chest pain is worse with palpation.    Past Medical History:  Diagnosis Date   ADHD (attention deficit hyperactivity disorder)    Anxiety    Renal disorder     Home Medications Prior to Admission medications   Medication Sig Start Date End Date Taking? Authorizing Provider  doxycycline (VIBRAMYCIN) 100 MG capsule Take 1 capsule (100 mg total) by mouth 2 (two) times daily. One po bid x 7 days 01/24/23  Yes Zadie Rhine, MD  ALPRAZolam (XANAX PO) Take by mouth.    [provider]  Amphetamine-Dextroamphetamine (ADDERALL PO) Take by mouth.    [provider]      Allergies    Patient has no known allergies.    Review of Systems   Review of Systems  Cardiovascular:  Positive for leg swelling.  Psychiatric/Behavioral:  The patient is nervous/anxious.     Physical Exam Updated Vital Signs BP 122/75 (BP Location: Right Arm)   Pulse 90   Temp 98.4 F (36.9 C) (Oral)   Resp 17   Ht 1.702 m (5\' 7" )   Wt 63.5 kg   LMP 12/16/2022 (Approximate)   SpO2 99%   BMI 21.93 kg/m  Physical Exam CONSTITUTIONAL: Disheveled, anxious HEAD: Normocephalic/atraumatic EYES: EOMI/PERRL ENMT: Mucous membranes moist NECK: supple no meningeal  signs CV: S1/S2 noted, no murmurs/rubs/gallops noted LUNGS: Lungs are clear to auscultation bilaterally, no apparent distress ABDOMEN: soft, nontender NEURO: Pt is awake/alert/appropriate, moves all extremitiesx4.  No facial droop.   EXTREMITIES: pulses normal/equal, full ROM See photo below Diffuse tenderness noted to the left lower tib-fib and ankle.  No crepitus.  There is erythema throughout the lower extremity.  Multiple wounds are noted, scab is noted on the tib-fib surface.  There is a small skin abrasion on the dorsal surface of the left foot No other calf tenderness proximal to the erythema.  No tenderness noted left knee with full range of motion of left knee.  Limitation range of motion of left ankle due to pain Right lower extremity has no significant edema or erythema SKIN: warm, color normal, see photo below No obvious injection sites are noted on the feet or webspaces PSYCH: Anxious     ED Results / Procedures / Treatments   Labs (all labs ordered are listed, but only abnormal results are displayed) Labs Reviewed  CBC - Abnormal; Notable for the following components:      Result Value   RBC 3.85 (*)    Hemoglobin 10.3 (*)    HCT 32.7 (*)    All other components within normal limits  BASIC METABOLIC PANEL - Abnormal; Notable for the following components:   Potassium 3.3 (*)  Calcium 8.6 (*)    All other components within normal limits  HCG, QUANTITATIVE, PREGNANCY    EKG EKG Interpretation  Date/Time:  Sunday January 24 2023 04:49:09 EDT Ventricular Rate:  80 PR Interval:  148 QRS Duration: 88 QT Interval:  389 QTC Calculation: 449 R Axis:   97 Text Interpretation: Sinus rhythm Borderline right axis deviation No previous ECGs available Confirmed by Zadie Rhine ([id number]) on 01/24/2023 4:53:40 AM  Radiology DG Tibia/Fibula Left  Result Date: 01/24/2023 CLINICAL DATA:  27 year old female with lower extremity swelling and pain for 4 days. Possible "bit by  something". EXAM: LEFT TIBIA AND FIBULA - 2 VIEW COMPARISON:  None Available. FINDINGS: Bone mineralization is within normal limits. Maintained alignment at the left knee and ankle. No joint effusion visible at either site. No osseous abnormality identified. Evidence of diffuse subcutaneous stranding. No soft tissue gas. No radiopaque foreign body identified. IMPRESSION: Evidence of diffuse subcutaneous stranding. No other radiographic abnormality. Electronically Signed   By: Odessa Fleming M.D.   On: 01/24/2023 04:54    Procedures Procedures    Medications Ordered in ED Medications  ibuprofen (ADVIL) tablet 400 mg (400 mg Oral Given 01/24/23 0453)  doxycycline (VIBRA-TABS) tablet 100 mg (100 mg Oral Given 01/24/23 0557)    ED Course/ Medical Decision Making/ A&P Clinical Course as of 01/24/23 0559  Sun Jan 24, 2023  0558 Patient presented with left leg swelling pain and redness as well as wounds.  Strong suspicion this represents cellulitis.  She is low risk for septic joint.  Low suspicion for DVT at this time.  She is not septic appearing.  Plan to start oral antibiotics. [DW]    Clinical Course User Index [DW] Zadie Rhine, MD                             Medical Decision Making Amount and/or Complexity of Data Reviewed Labs: ordered. Radiology: ordered. ECG/medicine tests: ordered.  Risk Prescription drug management.   This patient presents to the ED for concern of leg pain, this involves an extensive number of treatment options, and is a complaint that carries with it a high risk of complications and morbidity.  The differential diagnosis includes but is not limited to cellulitis, abscess, septic joint, DVT  Comorbidities that complicate the patient evaluation: Patient's presentation is complicated by their history of ADHD  Social Determinants of Health: Patient's impaired access to primary care  increases the complexity of managing their presentation  Additional history  obtained: Records reviewed Care Everywhere/External Records  Lab Tests: I Ordered, and personally interpreted labs.  The pertinent results include: Mild anemia  Imaging Studies ordered: I ordered imaging studies including X-ray tib-fib   I independently visualized and interpreted imaging which showed no acute fracture, no soft tissue gas I agree with the radiologist interpretation   Medicines ordered and prescription drug management: I ordered medication including ibuProfen for pain Reevaluation of the patient after these medicines showed that the patient    improved  Test Considered: Low risk and low suspicion for DVT, physical exam is more consistent with infectious etiology.  Will defer DVT workup   Reevaluation: After the interventions noted above, I reevaluated the patient and found that they have :improved  Complexity of problems addressed: Patient's presentation is most consistent with  acute complicated illness/injury requiring diagnostic workup  Disposition: After consideration of the diagnostic results and the patient's response to treatment,  I feel that the  patent would benefit from discharge   .    Patient had mentioned chest pain that was worse with palpation and movement.  Overall vital signs unremarkable.  EKG unremarkable.  Will defer further workup at this time       Final Clinical Impression(s) / ED Diagnoses Final diagnoses:  Cellulitis of left lower extremity    Rx / DC Orders ED Discharge Orders          Ordered    doxycycline (VIBRAMYCIN) 100 MG capsule  2 times daily        01/24/23 0552              Zadie Rhine, MD 01/24/23 [phone]

## 2023-07-27 ENCOUNTER — Other Ambulatory Visit: Payer: Self-pay

## 2023-07-27 ENCOUNTER — Encounter (HOSPITAL_BASED_OUTPATIENT_CLINIC_OR_DEPARTMENT_OTHER): Payer: Self-pay | Admitting: Emergency Medicine

## 2023-07-27 ENCOUNTER — Emergency Department (HOSPITAL_BASED_OUTPATIENT_CLINIC_OR_DEPARTMENT_OTHER)
Admission: EM | Admit: 2023-07-27 | Discharge: 2023-07-27 | Disposition: A | Discharge status: Home / Self Care | Payer: MEDICAID | Attending: Emergency Medicine | Admitting: Emergency Medicine

## 2023-07-27 DIAGNOSIS — K047 Periapical abscess without sinus: Secondary | ICD-10-CM | POA: Diagnosis not present

## 2023-07-27 DIAGNOSIS — K0381 Cracked tooth: Secondary | ICD-10-CM | POA: Insufficient documentation

## 2023-07-27 DIAGNOSIS — K029 Dental caries, unspecified: Secondary | ICD-10-CM | POA: Insufficient documentation

## 2023-07-27 DIAGNOSIS — R220 Localized swelling, mass and lump, head: Secondary | ICD-10-CM | POA: Diagnosis present

## 2023-07-27 MED ORDER — AMOXICILLIN-POT CLAVULANATE 875-125 MG PO TABS: 1.0000 | ORAL_TABLET | Freq: Two times a day (BID) | ORAL | 0 refills | Status: AC

## 2023-07-27 MED ORDER — AMOXICILLIN-POT CLAVULANATE 875-125 MG PO TABS
1.0000 | ORAL_TABLET | Freq: Once | ORAL | Status: AC
  Administered 2023-07-27: 1 via ORAL
  Filled 2023-07-27: qty 1

## 2023-07-27 NOTE — ED Provider Notes (Signed)
[place] EMERGENCY DEPARTMENT AT [facility] Provider Note   CSN: [id number] Arrival date & time: 07/27/23  2002     History  Chief Complaint  Patient presents with   Facial Swelling   Dental Pain    Angel Olson is a 27 y.o. female with PMHx ADHD and anxiety who presents to ED concerned for right upper dental pain and swelling x3 days. States that she broke her teeth many years ago and these symptoms have never happened before. Denies throat swelling, difficulty breathing, difficulty tolerating PO intake. Has not been able to see dentist recently.   Denies fever, chest pain, dyspnea, cough, nausea, vomiting.    Dental Pain      Home Medications Prior to Admission medications   Medication Sig Start Date End Date Taking? Authorizing Provider  amoxicillin-clavulanate (AUGMENTIN) 875-125 MG tablet Take 1 tablet by mouth every 12 (twelve) hours for 7 days. 07/27/23 08/03/23 Yes Benisha Hadaway F, PA-C  ALPRAZolam (XANAX PO) Take by mouth.    [provider]  Amphetamine-Dextroamphetamine (ADDERALL PO) Take by mouth.    [provider]      Allergies    Hydrocodone-acetaminophen    Review of Systems   Review of Systems  HENT:  Positive for dental problem.     Physical Exam Updated Vital Signs BP (!) 105/59 (BP Location: Left Arm)   Pulse 76   Temp 97.8 F (36.6 C)   Resp 18   Ht 5\' 7"  (1.702 m)   Wt 63.5 kg   LMP 07/13/2023 (Approximate)   SpO2 99%   BMI 21.93 kg/m  Physical Exam Vitals and nursing note reviewed.  Constitutional:      General: She is not in acute distress. HENT:     Head: Normocephalic and atraumatic.     Mouth/Throat:     Mouth: Mucous membranes are moist.     Pharynx: Uvula midline. No oropharyngeal exudate or posterior oropharyngeal erythema.      Comments: Many missing and broken molars in posterior oropharynx. Multiple carries scattered throughout mouth. No oral lesions. Very mild swelling of upper  right molar area - no fluctuance appreciated. No obvious external swelling palpated. No tonsillar swelling or exudates.  Eyes:     General: No scleral icterus.       Right eye: No discharge.        Left eye: No discharge.     Conjunctiva/sclera: Conjunctivae normal.  Cardiovascular:     Rate and Rhythm: Normal rate and regular rhythm.     Pulses: Normal pulses.     Heart sounds: Normal heart sounds. No murmur heard. Pulmonary:     Effort: Pulmonary effort is normal. No respiratory distress.     Breath sounds: Normal breath sounds. No wheezing, rhonchi or rales.  Abdominal:     General: Abdomen is flat.  Skin:    General: Skin is warm and dry.     Findings: No rash.  Neurological:     General: No focal deficit present.     Mental Status: She is alert. Mental status is at baseline.  Psychiatric:        Mood and Affect: Mood normal.     ED Results / Procedures / Treatments   Labs (all labs ordered are listed, but only abnormal results are displayed) Labs Reviewed - No data to display  EKG None  Radiology No results found.  Procedures Procedures    Medications Ordered in ED Medications  amoxicillin-clavulanate (AUGMENTIN) 875-125  MG per tablet 1 tablet (has no administration in time range)    ED Course/ Medical Decision Making/ A&P                                 Medical Decision Making   This patient presents to the ED for concern of dental pain, this involves an extensive number of treatment options, and is a complaint that carries with it a high risk of complications and morbidity.  The differential diagnosis includes deep space infection, carries, broken teeth, sepsis   Co morbidities that complicate the patient evaluation  none   Additional history obtained:  It does not appear that patient is established with PCP. Will refer to [facility]   Problem List / ED Course / Critical interventions / Medication management  Patient presents to ED  concerned for dental pain.  Physical exam showing multiple carries and broken teeth throughout mouth. No oral lesions. Very mild swelling of upper right molar area. No difficulty tolerating PO intake or breathing. No throat swelling. Rest of physical exam unremarkable. Patient afebrile with stable vitals. Provided patient with a dose of Augmentin in ED and with discharge with a 7 day course of Augmentin. Provided patient with list for dentist in the area. Educated patient that symptoms will return and may become more complicated if dentist follow up is not obtained. Patient verbally endorsed understanding of plan. I have reviewed the patients home medicines and have made adjustments as needed Patient afebrile with stable vitals. Provided with return precautions. Discharged in good condition.  Ddx: these are considered less likely due to history of present illness and physical exam findings -Deep space infection of head/neck: Denies dyspnea, dysphagia, trismus -Sepsis: patient afebrile without tachycardia or hypotension; no leukocytosis    Social Determinants of Health:  none           Final Clinical Impression(s) / ED Diagnoses Final diagnoses:  Dental infection    Rx / DC Orders ED Discharge Orders          Ordered    amoxicillin-clavulanate (AUGMENTIN) 875-125 MG tablet  Every 12 hours        07/27/23 2054              Dorthy Cooler, [place] 07/27/23 2101    Loetta Rough, MD 07/28/23 0345

## 2023-07-27 NOTE — ED Triage Notes (Signed)
Right upper dental pain associated with right sided facial swelling x 3 days.

## 2023-07-27 NOTE — Discharge Instructions (Addendum)
It was a pleasure caring for you today. I have sent the antibiotic prescription to your pharmacy. Please follow up with primary care. As discussed, you will also need to follow up with Dentist to prevent recurrence of dental infection. Seek emergency care if experiencing and new or worsening symptoms.

## 2023-07-29 ENCOUNTER — Other Ambulatory Visit: Payer: Self-pay

## 2023-07-29 ENCOUNTER — Encounter (HOSPITAL_BASED_OUTPATIENT_CLINIC_OR_DEPARTMENT_OTHER): Payer: Self-pay | Admitting: Emergency Medicine

## 2023-07-29 ENCOUNTER — Emergency Department (HOSPITAL_BASED_OUTPATIENT_CLINIC_OR_DEPARTMENT_OTHER)
Admission: EM | Admit: 2023-07-29 | Discharge: 2023-07-29 | Discharge status: Against Medical Advice | Payer: MEDICAID | Attending: Emergency Medicine | Admitting: Emergency Medicine

## 2023-07-29 DIAGNOSIS — K0889 Other specified disorders of teeth and supporting structures: Secondary | ICD-10-CM | POA: Insufficient documentation

## 2023-07-29 DIAGNOSIS — R220 Localized swelling, mass and lump, head: Secondary | ICD-10-CM | POA: Diagnosis not present

## 2023-07-29 DIAGNOSIS — Z5321 Procedure and treatment not carried out due to patient leaving prior to being seen by health care provider: Secondary | ICD-10-CM | POA: Diagnosis not present

## 2023-07-29 NOTE — ED Triage Notes (Signed)
Pt seen recently for dental pain/infection.  Was given one dose of amoxicillin and woke up the next day with swelling to face on same side of dental infection.  Pt did not take any more antibiotics as yet as she was afraid that she was having an allergic reaction.  No sob, no airway impairment.

## 2023-07-29 NOTE — ED Notes (Signed)
Patient called for 3 times in lobby, no answer

## 2024-06-22 ENCOUNTER — Other Ambulatory Visit: Payer: Self-pay

## 2024-06-22 ENCOUNTER — Emergency Department (HOSPITAL_BASED_OUTPATIENT_CLINIC_OR_DEPARTMENT_OTHER): Payer: MEDICAID

## 2024-06-22 ENCOUNTER — Encounter (HOSPITAL_BASED_OUTPATIENT_CLINIC_OR_DEPARTMENT_OTHER): Payer: Self-pay | Admitting: Emergency Medicine

## 2024-06-22 ENCOUNTER — Emergency Department (HOSPITAL_BASED_OUTPATIENT_CLINIC_OR_DEPARTMENT_OTHER)
Admission: EM | Admit: 2024-06-22 | Discharge: 2024-06-22 | Disposition: A | Discharge status: Home / Self Care | Payer: MEDICAID | Attending: Emergency Medicine | Admitting: Emergency Medicine

## 2024-06-22 DIAGNOSIS — R110 Nausea: Secondary | ICD-10-CM | POA: Diagnosis not present

## 2024-06-22 DIAGNOSIS — R1011 Right upper quadrant pain: Secondary | ICD-10-CM | POA: Diagnosis present

## 2024-06-22 LAB — CBC
HCT: 28.5 % — ABNORMAL LOW (ref 36.0–46.0)
Hemoglobin: 8.9 g/dL — ABNORMAL LOW (ref 12.0–15.0)
MCH: 25.3 pg — ABNORMAL LOW (ref 26.0–34.0)
MCHC: 31.2 g/dL (ref 30.0–36.0)
MCV: 81 fL (ref 80.0–100.0)
Platelets: 575 K/uL — ABNORMAL HIGH (ref 150–400)
RBC: 3.52 MIL/uL — ABNORMAL LOW (ref 3.87–5.11)
RDW: 16.8 % — ABNORMAL HIGH (ref 11.5–15.5)
WBC: 10.6 K/uL — ABNORMAL HIGH (ref 4.0–10.5)
nRBC: 0 % (ref 0.0–0.2)

## 2024-06-22 LAB — COMPREHENSIVE METABOLIC PANEL WITH GFR
ALT: 14 U/L (ref 0–44)
AST: 16 U/L (ref 15–41)
Albumin: 3.7 g/dL (ref 3.5–5.0)
Alkaline Phosphatase: 98 U/L (ref 38–126)
Anion gap: 10 (ref 5–15)
BUN: 5 mg/dL — ABNORMAL LOW (ref 6–20)
CO2: 27 mmol/L (ref 22–32)
Calcium: 8.8 mg/dL — ABNORMAL LOW (ref 8.9–10.3)
Chloride: 100 mmol/L (ref 98–111)
Creatinine, Ser: 0.63 mg/dL (ref 0.44–1.00)
GFR, Estimated: >60 mL/min (ref >60)
Glucose, Bld: 76 mg/dL (ref 70–99)
Potassium: 3.4 mmol/L — ABNORMAL LOW (ref 3.5–5.1)
Sodium: 137 mmol/L (ref 135–145)
Total Bilirubin: 0.2 mg/dL (ref 0.0–1.2)
Total Protein: 7.2 g/dL (ref 6.5–8.1)

## 2024-06-22 LAB — URINALYSIS, ROUTINE W REFLEX MICROSCOPIC
Glucose, UA: NEGATIVE mg/dL
Hgb urine dipstick: NEGATIVE
Ketones, ur: NEGATIVE mg/dL
Leukocytes,Ua: NEGATIVE
Nitrite: NEGATIVE
Protein, ur: 100 mg/dL — AB
Specific Gravity, Urine: >=1.03 (ref 1.005–1.030)
pH: 5.5 (ref 5.0–8.0)

## 2024-06-22 LAB — LIPASE, BLOOD: Lipase: 16 U/L (ref 11–51)

## 2024-06-22 LAB — URINALYSIS, MICROSCOPIC (REFLEX)

## 2024-06-22 LAB — PREGNANCY, URINE: Preg Test, Ur: NEGATIVE

## 2024-06-22 MED ORDER — ONDANSETRON HCL 4 MG/2ML IJ SOLN
4.0000 mg | Freq: Once | INTRAMUSCULAR | Status: AC
  Administered 2024-06-22: 4 mg via INTRAVENOUS
  Filled 2024-06-22: qty 2

## 2024-06-22 MED ORDER — OXYCODONE-ACETAMINOPHEN 5-325 MG PO TABS
1.0000 | ORAL_TABLET | ORAL | Status: DC | PRN
  Administered 2024-06-22: 1 via ORAL
  Filled 2024-06-22: qty 1

## 2024-06-22 MED ORDER — ONDANSETRON 4 MG PO TBDP
4.0000 mg | ORAL_TABLET | Freq: Once | ORAL | Status: AC
  Administered 2024-06-22: 4 mg via ORAL
  Filled 2024-06-22: qty 1

## 2024-06-22 MED ORDER — MORPHINE SULFATE (PF) 4 MG/ML IV SOLN
4.0000 mg | Freq: Once | INTRAVENOUS | Status: AC
  Administered 2024-06-22: 4 mg via INTRAVENOUS
  Filled 2024-06-22: qty 1

## 2024-06-22 MED ORDER — IOHEXOL 300 MG/ML  SOLN
100.0000 mL | Freq: Once | INTRAMUSCULAR | Status: AC | PRN
Start: 2024-06-22 — End: 2024-06-22
  Administered 2024-06-22: 100 mL via INTRAVENOUS

## 2024-06-22 NOTE — ED Triage Notes (Signed)
 Upper right abdominal pain X 1 week.

## 2024-06-22 NOTE — ED Provider Notes (Signed)
 [place] EMERGENCY DEPARTMENT AT [facility] Provider Note   CSN: [id number] Arrival date & time: 06/22/24  0114     Patient presents with: Abdominal Pain   Angel Olson is a 28 y.o. female.   Patient is a 28 year old female with history of ADHD, anxiety.  Patient presenting today with complaints of right upper quadrant pain.  This has been ongoing for the past week, but became much worse today.  She denies any fevers or chills.  She has felt nauseated, but has not vomited.  She denies prior abdominal surgeries.       Prior to Admission medications   Medication Sig Start Date End Date Taking? Authorizing Provider  ALPRAZolam (XANAX PO) Take by mouth.    [provider]  Amphetamine-Dextroamphetamine (ADDERALL PO) Take by mouth.    [provider]    Allergies: Hydrocodone -acetaminophen     Review of Systems  All other systems reviewed and are negative.   Updated Vital Signs BP 105/65 (BP Location: Right Arm)   Pulse 93   Temp 98.3 F (36.8 C)   Resp 18   Ht 5' 7 (1.702 m)   Wt 63.5 kg   LMP 06/07/2024   SpO2 100%   BMI 21.93 kg/m   Physical Exam Vitals and nursing note reviewed.  Constitutional:      General: She is not in acute distress.    Appearance: She is well-developed. She is not diaphoretic.  HENT:     Head: Normocephalic and atraumatic.  Cardiovascular:     Rate and Rhythm: Normal rate and regular rhythm.     Heart sounds: No murmur heard.    No friction rub. No gallop.  Pulmonary:     Effort: Pulmonary effort is normal. No respiratory distress.     Breath sounds: Normal breath sounds. No wheezing.  Abdominal:     General: Bowel sounds are normal. There is no distension.     Palpations: Abdomen is soft.     Tenderness: There is abdominal tenderness in the right upper quadrant. There is guarding. There is no right CVA tenderness, left CVA tenderness or rebound.     Comments: There is tenderness to the right upper  quadrant with voluntary guarding.  Musculoskeletal:        General: Normal range of motion.     Cervical back: Normal range of motion and neck supple.  Skin:    General: Skin is warm and dry.  Neurological:     General: No focal deficit present.     Mental Status: She is alert and oriented to person, place, and time.     (all labs ordered are listed, but only abnormal results are displayed) Labs Reviewed  LIPASE, BLOOD  COMPREHENSIVE METABOLIC PANEL WITH GFR  CBC  URINALYSIS, ROUTINE W REFLEX MICROSCOPIC  PREGNANCY, URINE    EKG: None  Radiology: No results found.   Procedures   Medications Ordered in the ED  oxyCODONE -acetaminophen  (PERCOCET/ROXICET) 5-325 MG per tablet 1 tablet (1 tablet Oral Given 06/22/24 0140)  morphine  (PF) 4 MG/ML injection 4 mg (has no administration in time range)  ondansetron  (ZOFRAN ) injection 4 mg (has no administration in time range)  ondansetron  (ZOFRAN -ODT) disintegrating tablet 4 mg (4 mg Oral Given 06/22/24 0133)                                    Medical Decision Making Amount and/or  Complexity of Data Reviewed Labs: ordered. Radiology: ordered.  Risk Prescription drug management.   Patient is a 28 year old female presenting with complaints of severe right upper quadrant pain as described in the HPI.  Patient arrives here with stable vital signs and is afebrile.  She is moaning and carrying on quite dramatically.  Physical examination does reveal tenderness in the right upper quadrant.  Laboratory studies obtained including CBC, CMP, and lipase.  White count is 10.6 and hemoglobin is 8.9, but laboratory studies otherwise unremarkable.  CMP and lipase also unremarkable.  Urinalysis is inconsistent with UTI and pregnancy test is negative.  Patient has received IV morphine  and Zofran .  She was sent for CT scan during which time her IV infiltrated.  She refused to allow an attempt at another IV, then informed the nursing staff as she  was leaving.  She gather her belongings, then left the emergency department prior to me being able to speak with her.     Final diagnoses:  None    ED Discharge Orders     None          Geroldine Berg, MD 06/22/24 [id number]
